# Patient Record
Sex: Female | Born: 1955
Health system: Southern US, Community
[De-identification: ages and names within clinical notes are randomized; demographics above are authoritative.]

---

## 2015-09-02 ENCOUNTER — Other Ambulatory Visit (HOSPITAL_COMMUNITY): Payer: Self-pay | Admitting: Neurological Surgery

## 2015-09-02 ENCOUNTER — Other Ambulatory Visit: Payer: Self-pay | Admitting: Neurological Surgery

## 2015-09-02 DIAGNOSIS — M5416 Radiculopathy, lumbar region: Secondary | ICD-10-CM

## 2015-09-10 ENCOUNTER — Ambulatory Visit (HOSPITAL_COMMUNITY)
Admission: RE | Admit: 2015-09-10 | Discharge: 2015-09-10 | Disposition: A | Discharge status: Home / Self Care | Payer: 59 | Source: Ambulatory Visit | Attending: Neurological Surgery | Admitting: Neurological Surgery

## 2015-09-10 DIAGNOSIS — M4726 Other spondylosis with radiculopathy, lumbar region: Secondary | ICD-10-CM | POA: Diagnosis not present

## 2015-09-10 DIAGNOSIS — M5416 Radiculopathy, lumbar region: Secondary | ICD-10-CM

## 2015-09-10 DIAGNOSIS — M419 Scoliosis, unspecified: Secondary | ICD-10-CM | POA: Insufficient documentation

## 2015-09-10 DIAGNOSIS — M4806 Spinal stenosis, lumbar region: Secondary | ICD-10-CM | POA: Insufficient documentation

## 2015-09-10 MED ORDER — DIAZEPAM 5 MG PO TABS
ORAL_TABLET | ORAL | Status: AC
  Filled 2015-09-10: qty 2

## 2015-09-10 MED ORDER — DEXAMETHASONE 2 MG PO TABS
2.0000 mg | ORAL_TABLET | Freq: Once | ORAL | Status: DC
  Filled 2015-09-10: qty 1

## 2015-09-10 MED ORDER — ONDANSETRON HCL 4 MG/2ML IJ SOLN: 4.0000 mg | Freq: Four times a day (QID) | INTRAMUSCULAR | Status: DC | PRN

## 2015-09-10 MED ORDER — LIDOCAINE HCL (PF) 1 % IJ SOLN
INTRAMUSCULAR | Status: AC
  Administered 2015-09-10: 5 mL via INTRAMUSCULAR
  Filled 2015-09-10: qty 10

## 2015-09-10 MED ORDER — DIAZEPAM 5 MG PO TABS
10.0000 mg | ORAL_TABLET | Freq: Once | ORAL | Status: AC
  Administered 2015-09-10: 10 mg via ORAL

## 2015-09-10 MED ORDER — HYDROCODONE-ACETAMINOPHEN 5-325 MG PO TABS
ORAL_TABLET | ORAL | Status: AC
  Filled 2015-09-10: qty 2

## 2015-09-10 MED ORDER — HYDROCODONE-ACETAMINOPHEN 5-325 MG PO TABS
1.0000 | ORAL_TABLET | ORAL | Status: DC | PRN
  Administered 2015-09-10: 2 via ORAL

## 2015-09-10 MED ORDER — IOHEXOL 180 MG/ML  SOLN
20.0000 mL | Freq: Once | INTRAMUSCULAR | Status: AC | PRN
  Administered 2015-09-10: 12 mL via INTRATHECAL

## 2015-09-10 NOTE — Procedures (Signed)
Jade Santiago is a 60 year old individual who's had significant back and mostly left lower extremity pain she occasionally gets right lower extremity pain and she had an MRI brought 6 months ago that demonstrated that she had some moderate spondylitic changes at L4-5 and L5-S1. A plain x-ray recently demonstrates that she has a scoliotic deformity in the lower lumbar spine. She's not been getting better despite efforts at conservative management including an exercise program physical therapy and to epidural secured injections pain is chronic unremitting and she desires something definitive be done as she is not able to rest or pursue activities intolerable fashion. A myelogram is now been advised to further examine the anatomic nature of her lower lumbar spine.  Pre op Dx: Lumbar radiculopathy chronic Post op Dx: Lumbar radiculopathy chronic Procedure: Lumbar myelogram Surgeon: Scotty Pinder Puncture level: L2-3 Fluid color: Clear colorless Injection: Iohexol 180. 12 mL Findings: Significant spondylosis with some lateral recess stenosis left greater than right at L4-L5 particularly. Further evaluation with CT scanning

## 2015-09-10 NOTE — Discharge Instructions (Signed)
Myelogram and Lumbar Puncture Discharge Instructions ° °1. Go home and rest quietly for the next 24 hours.  It is important to lie flat for the next 24 hours.  Get up only to go to the restroom.  You may lie in the bed or on a couch on your back, your stomach, your left side or your right side.  You may have one pillow under your head.  You may have pillows between your knees while you are on your side or under your knees while you are on your back. ° °2. DO NOT drive today.  Recline the seat as far back as it will go, while still wearing your seat belt, on the way home. ° °3. You may get up to go to the bathroom as needed.  You may sit up for 10 minutes to eat.  You may resume your normal diet and medications unless otherwise indicated. ° °4. The incidence of headache, nausea, or vomiting is about 5% (one in 20 patients).  If you develop a headache, lie flat and drink plenty of fluids until the headache goes away.  Caffeinated beverages may be helpful.  If you develop severe nausea and vomiting or a headache that does not go away with flat bed rest, call Dr Elsner ° °5. You may resume normal activities after your 24 hours of bed rest is over; however, do not exert yourself strongly or do any heavy lifting tomorrow. ° °6. Call your physician for a follow-up appointment.  The results of your myelogram will be sent directly to your physician by the following day. ° °7. If you have any questions or if complications develop after you arrive home, please call Dr Elsner ° °Discharge instructions have been explained to the patient.  The patient, or the person responsible for the patient, fully understands these instructions. ° ° °

## 2017-04-12 IMAGING — RF DG MYELOGRAM LUMBAR
8 series · 8 of 8 positions shown · non-contrast
Comparison: [HOSPITAL] neurosurgery lumbar radiographs 08/27/2015.
Outside [facility] lumbar MRI 10/23/2014 available on
[[facility]OSPITAL] PACS.

CLINICAL DATA: 59-year-old female with lumbar back pain radiating
to the left lower extremity, occasionally the right lower extremity.
Scoliosis. Subsequent encounter.
TECHNIQUE: Contiguous axial images were obtained through the Lumbar spine after
the intrathecal infusion of infusion. Coronal and sagittal
reconstructions were obtained of the axial image sets.

[Series 1: cp_standard · 0.28mm/px · 1 of 1 slices shown]
[im 1/1]
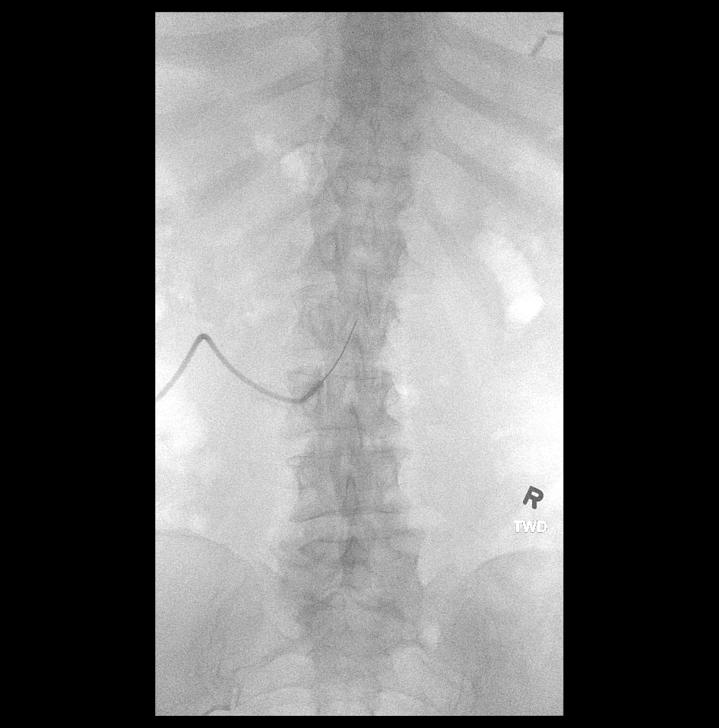

[Series 2: fluoro_myelogram_singleshot_bw · 0.19mm/px · 1 of 1 slices shown (1 of 7)]
[im 1/1]
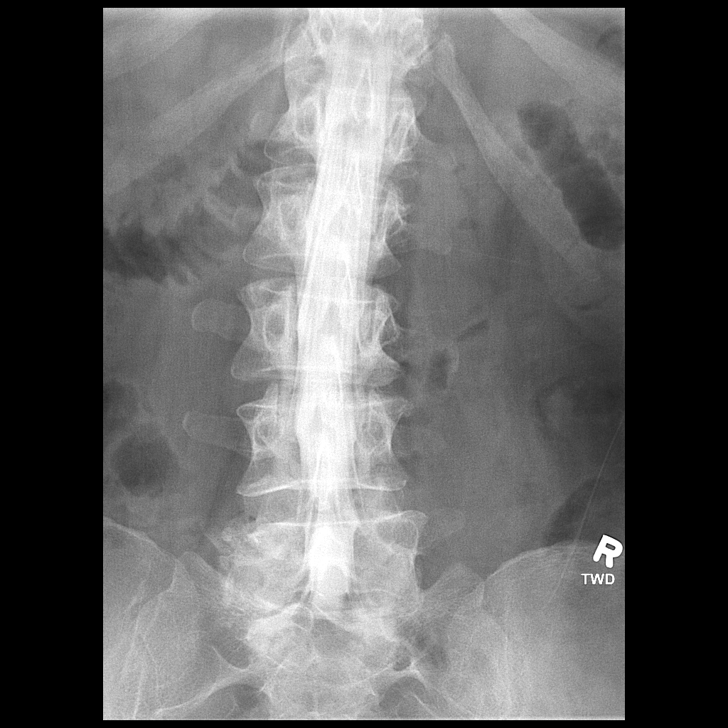

[Series 3: fluoro_myelogram_singleshot_bw · 0.19mm/px · 1 of 1 slices shown (2 of 7)]
[im 1/1]
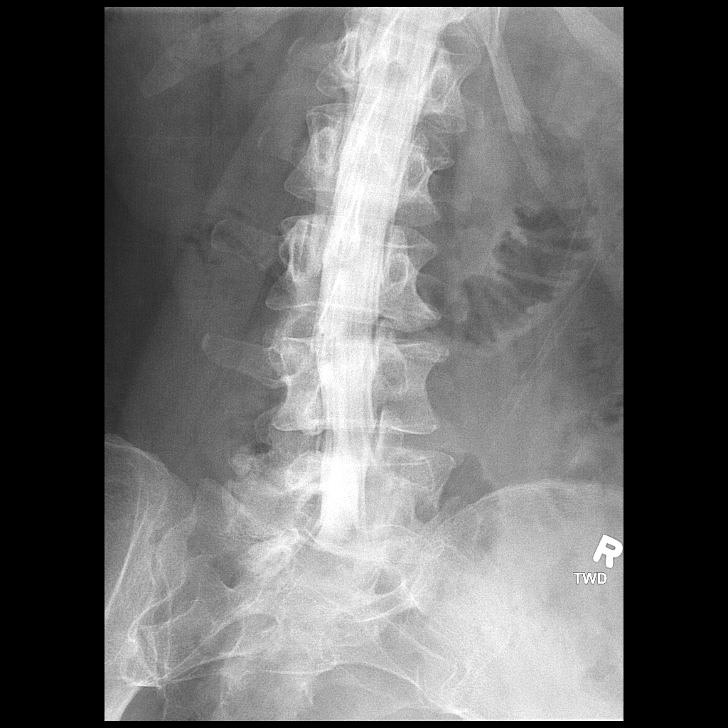

[Series 4: fluoro_myelogram_singleshot_bw · 0.19mm/px · 1 of 1 slices shown (3 of 7)]
[im 1/1]
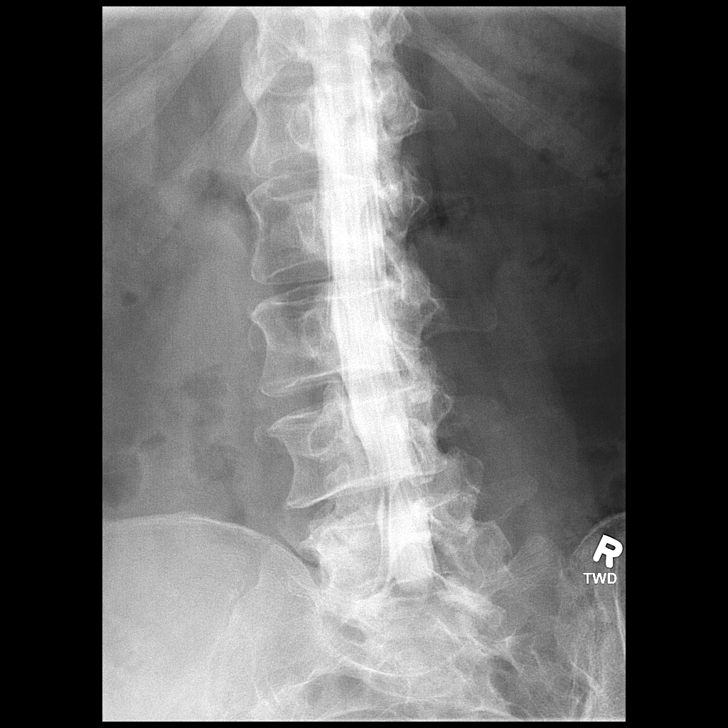

[Series 5: fluoro_myelogram_singleshot_bw · 0.18mm/px · 1 of 1 slices shown (4 of 7)]
[im 1/1]
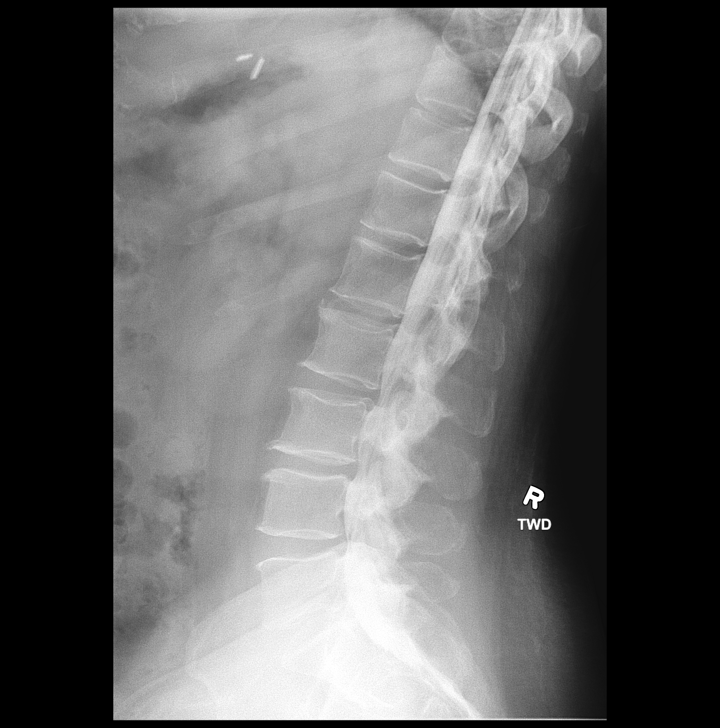

[Series 6: fluoro_myelogram_singleshot_bw · 0.18mm/px · 1 of 1 slices shown (5 of 7)]
[im 1/1]
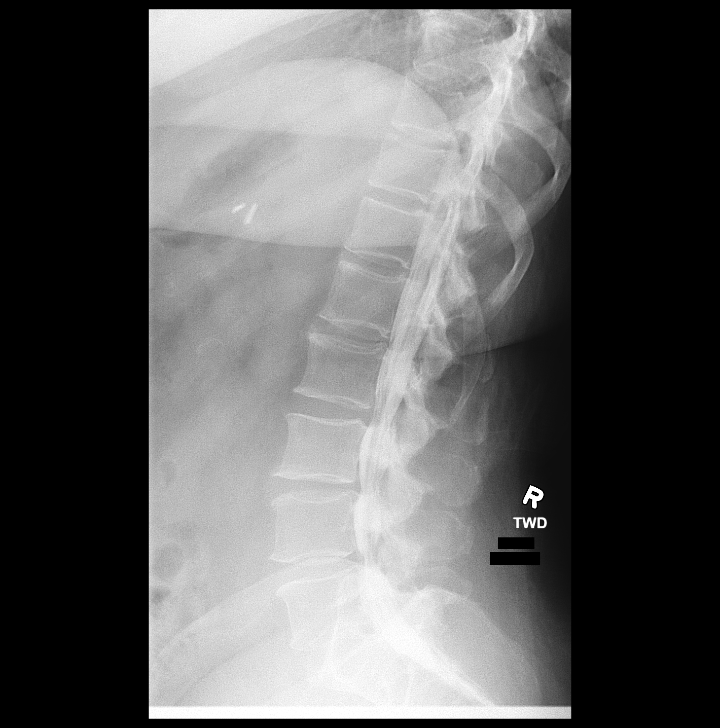

[Series 7: fluoro_myelogram_singleshot_bw · 0.17mm/px · 1 of 1 slices shown (6 of 7)]
[im 1/1]
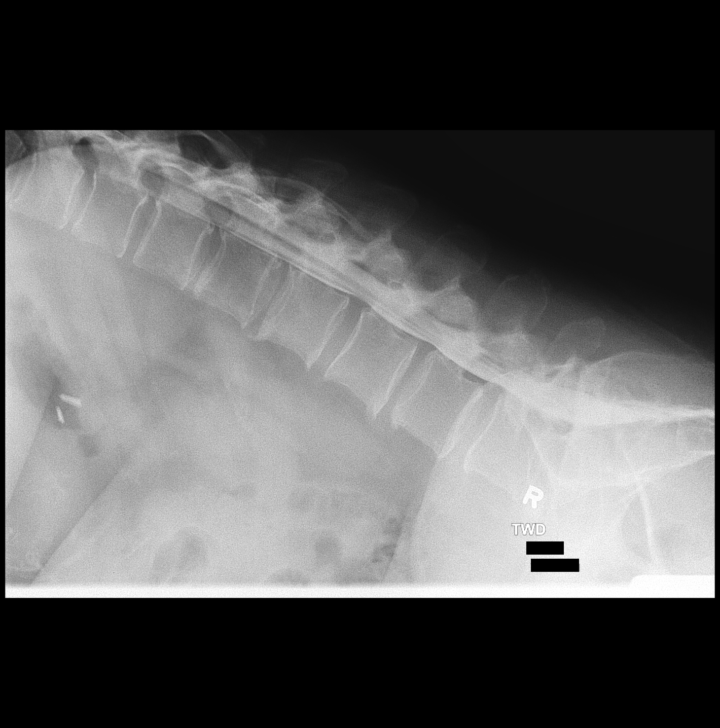

[Series 8: fluoro_myelogram_singleshot_bw · 0.17mm/px · 1 of 1 slices shown (7 of 7)]
[im 1/1]
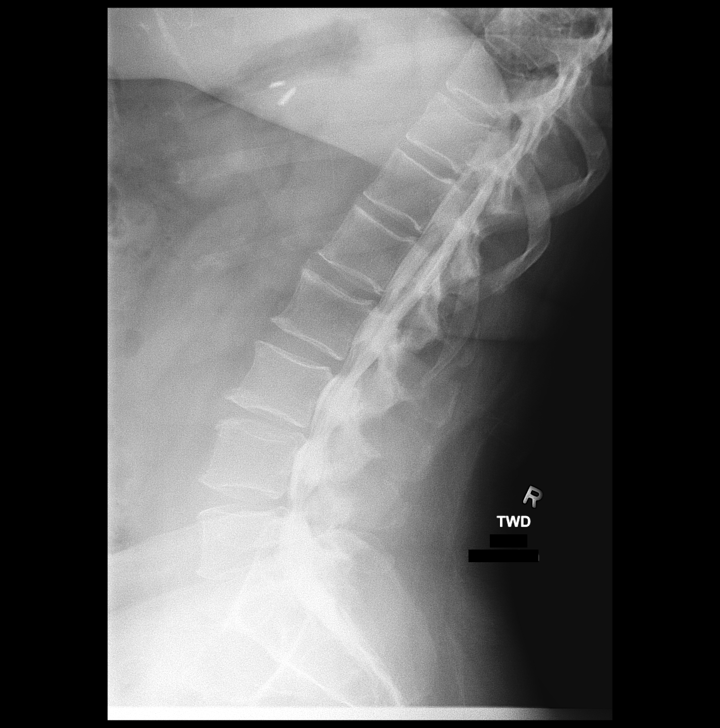

[8 of 8 positions shown; findings below may reference images not displayed]

EXAM:
LUMBAR MYELOGRAM

FLUOROSCOPY TIME:  0 minutes 36 seconds

PROCEDURE:
Lumbar puncture and intrathecal contrast administration were
performed by Dr. NARENDRA PATTON who will separately report for the
portion of the procedure. I personally supervised acquisition of the
myelogram images.

I personally supervised acquisition of the myelogram images.
FINDINGS: LUMBAR MYELOGRAM FINDINGS:

Normal lumbar segmentation. Levoconvex lumbar scoliosis is less
apparent when supine. Good intrathecal contrast opacification. On
supine images there is mild thecal sac narrowing at L4-L5. Mild to
moderate left lateral recess stenosis at that level (series 4).

On supine lateral view of the lumbar spine there are ventral defects
on the thecal sac from L2-L3 to L4-L5 compatible with disc disease.
On upright lateral imaging circumferential narrowing of the L4-L5
thecal sac then becomes apparent (image 6). Lateral weight-bearing
flexion and extension views are performed. The ventral thecal sac
mass effect regress is in flexion. There is subtle grade 1
anterolisthesis at L4-L5 seen only in flexion. In extension the
ventral thecal sac mass effect recurs. No abnormal motion identified
in extension.

CT LUMBAR MYELOGRAM FINDINGS:

Levoconvex lumbar scoliosis with a rotatory component. Straightening
of lordosis on the lateral view. No acute osseous abnormality
identified. Visible sacrum and SI joints intact.

Normal myelographic appearance of the lower thoracic spinal cord,
conus at L1-L2.

Negative visualized abdominal viscera. Negative posterior paraspinal
soft tissues.

T12-L1:  Mild facet hypertrophy.  No stenosis.

L1-L2: Moderate facet hypertrophy, greater on the right. No
stenosis.

L2-L3: Mild left eccentric circumferential disc bulge. Mild to
moderate facet and ligament flavum hypertrophy. Borderline to mild
right lateral recess stenosis (series 2, image 38). No spinal or
foraminal stenosis.

L3-L4: Right eccentric circumferential disc bulge. Mild to moderate
facet and ligament flavum hypertrophy. Mild right lateral recess
stenosis (series 2, image 53). Mild right L3 foraminal stenosis,
mild to moderate right L3 foraminal stenosis is stable and in part
related to endplate osteophytosis.

L4-L5: Left eccentric circumferential disc bulge. Moderate facet and
moderate to severe ligament flavum hypertrophy. Mild to moderate
left lateral recess stenosis (series 2, image 69). No significant
spinal stenosis. Moderate left L4 foraminal stenosis appears stable
and in large part due to facet hypertrophy.

L5-S1: Severe facet hypertrophy, greater on the left and with vacuum
facet phenomena (series 2, image 78. Mild left eccentric
circumferential disc bulge. No spinal or lateral recess stenosis.
Moderate left L5 foraminal stenosis related to disc and endplate
plus facet spurring. No right foraminal stenosis. Incidental S1
spina bifida occulta (series 2, image 84).
IMPRESSION: LUMBAR MYELOGRAM IMPRESSION:

1. Levoconvex lumbar scoliosis, more pronounced on weight-bearing
views.
2. Mild anterolisthesis develops at L4-L5 in flexion suggesting a
degree of instability at that level.
3. Mass effect on the thecal sac, most pronounced at L4-L5 on
weight-bearing views.

CT LUMBAR MYELOGRAM IMPRESSION:

1. No significant lumbar spinal stenosis.
2. Scoliosis with a rotatory component. Widespread lumbar disc
bulging and endplate spurring. Superimposed lumbar facet
degeneration, maximal at L5-S1 and greater on the left.
3. Multifactorial mild to moderate left lateral recess stenosis at
L4-L5. Mild right lateral recess stenosis at L2-L3 and L3-L4.
4. Moderate neural foraminal stenosis at the left L4 and L5 nerve
levels. Mild to moderate right foraminal stenosis at the L3 nerve
level.
